# Patient Record
Sex: Male | Born: 1940 | Race: White | Hispanic: No | Marital: Married | Smoking: Current every day smoker
Health system: Southern US, Community
[De-identification: ages and names within clinical notes are randomized; demographics above are authoritative.]

## PROBLEM LIST (undated history)

## (undated) DIAGNOSIS — K56609 Unspecified intestinal obstruction, unspecified as to partial versus complete obstruction: Secondary | ICD-10-CM

---

## 2016-03-07 DIAGNOSIS — E876 Hypokalemia: Secondary | ICD-10-CM | POA: Diagnosis not present

## 2016-03-07 DIAGNOSIS — K56600 Partial intestinal obstruction, unspecified as to cause: Secondary | ICD-10-CM | POA: Diagnosis not present

## 2016-03-07 DIAGNOSIS — K5900 Constipation, unspecified: Secondary | ICD-10-CM | POA: Diagnosis not present

## 2016-03-21 DIAGNOSIS — H26493 Other secondary cataract, bilateral: Secondary | ICD-10-CM | POA: Diagnosis not present

## 2016-06-26 DIAGNOSIS — R0981 Nasal congestion: Secondary | ICD-10-CM | POA: Diagnosis not present

## 2016-06-26 DIAGNOSIS — Z6823 Body mass index (BMI) 23.0-23.9, adult: Secondary | ICD-10-CM | POA: Diagnosis not present

## 2017-01-10 DIAGNOSIS — H6122 Impacted cerumen, left ear: Secondary | ICD-10-CM | POA: Diagnosis not present

## 2017-01-10 DIAGNOSIS — Z6823 Body mass index (BMI) 23.0-23.9, adult: Secondary | ICD-10-CM | POA: Diagnosis not present

## 2017-03-01 DIAGNOSIS — L570 Actinic keratosis: Secondary | ICD-10-CM | POA: Diagnosis not present

## 2017-03-26 DIAGNOSIS — H26493 Other secondary cataract, bilateral: Secondary | ICD-10-CM | POA: Diagnosis not present

## 2017-12-20 DIAGNOSIS — Z23 Encounter for immunization: Secondary | ICD-10-CM | POA: Diagnosis not present

## 2018-03-05 ENCOUNTER — Emergency Department (HOSPITAL_COMMUNITY): Payer: Medicare Other

## 2018-03-05 ENCOUNTER — Encounter (HOSPITAL_COMMUNITY): Payer: Self-pay | Admitting: Emergency Medicine

## 2018-03-05 ENCOUNTER — Emergency Department (HOSPITAL_COMMUNITY)
Admission: EM | Admit: 2018-03-05 | Discharge: 2018-03-05 | Disposition: A | Discharge status: Home / Self Care | Payer: Medicare Other | Attending: Emergency Medicine | Admitting: Emergency Medicine

## 2018-03-05 ENCOUNTER — Other Ambulatory Visit: Payer: Self-pay

## 2018-03-05 DIAGNOSIS — N210 Calculus in bladder: Secondary | ICD-10-CM | POA: Diagnosis not present

## 2018-03-05 DIAGNOSIS — K8020 Calculus of gallbladder without cholecystitis without obstruction: Secondary | ICD-10-CM | POA: Diagnosis not present

## 2018-03-05 DIAGNOSIS — R112 Nausea with vomiting, unspecified: Secondary | ICD-10-CM | POA: Diagnosis not present

## 2018-03-05 DIAGNOSIS — I714 Abdominal aortic aneurysm, without rupture, unspecified: Secondary | ICD-10-CM

## 2018-03-05 DIAGNOSIS — Z79899 Other long term (current) drug therapy: Secondary | ICD-10-CM | POA: Diagnosis not present

## 2018-03-05 DIAGNOSIS — R109 Unspecified abdominal pain: Secondary | ICD-10-CM | POA: Diagnosis not present

## 2018-03-05 DIAGNOSIS — R140 Abdominal distension (gaseous): Secondary | ICD-10-CM | POA: Diagnosis present

## 2018-03-05 HISTORY — DX: Unspecified intestinal obstruction, unspecified as to partial versus complete obstruction: K56.609

## 2018-03-05 LAB — CBC WITH DIFFERENTIAL/PLATELET
Abs Immature Granulocytes: 0.04 10*3/uL (ref 0.00–0.07)
BASOS ABS: 0 10*3/uL (ref 0.0–0.1)
Basophils Relative: 0 %
Eosinophils Absolute: 0.1 10*3/uL (ref 0.0–0.5)
Eosinophils Relative: 1 %
HCT: 49 % (ref 39.0–52.0)
Hemoglobin: 15.7 g/dL (ref 13.0–17.0)
Immature Granulocytes: 0 %
Lymphocytes Relative: 12 %
Lymphs Abs: 1.2 10*3/uL (ref 0.7–4.0)
MCH: 29.7 pg (ref 26.0–34.0)
MCHC: 32 g/dL (ref 30.0–36.0)
MCV: 92.6 fL (ref 80.0–100.0)
Monocytes Absolute: 0.9 10*3/uL (ref 0.1–1.0)
Monocytes Relative: 8 %
Neutro Abs: 8 10*3/uL — ABNORMAL HIGH (ref 1.7–7.7)
Neutrophils Relative %: 79 %
Platelets: 273 10*3/uL (ref 150–400)
RBC: 5.29 MIL/uL (ref 4.22–5.81)
RDW: 13.2 % (ref 11.5–15.5)
WBC: 10.2 10*3/uL (ref 4.0–10.5)
nRBC: 0 % (ref 0.0–0.2)

## 2018-03-05 LAB — COMPREHENSIVE METABOLIC PANEL
ALBUMIN: 3.7 g/dL (ref 3.5–5.0)
ALT: 17 U/L (ref 0–44)
ANION GAP: 7 (ref 5–15)
AST: 19 U/L (ref 15–41)
Alkaline Phosphatase: 37 U/L — ABNORMAL LOW (ref 38–126)
BUN: 28 mg/dL — ABNORMAL HIGH (ref 8–23)
CO2: 28 mmol/L (ref 22–32)
Calcium: 9.4 mg/dL (ref 8.9–10.3)
Chloride: 107 mmol/L (ref 98–111)
Creatinine, Ser: 1.12 mg/dL (ref 0.61–1.24)
GFR calc Af Amer: >60 mL/min (ref >60)
GFR calc non Af Amer: >60 mL/min (ref >60)
Glucose, Bld: 125 mg/dL — ABNORMAL HIGH (ref 70–99)
Potassium: 4.2 mmol/L (ref 3.5–5.1)
Sodium: 142 mmol/L (ref 135–145)
Total Bilirubin: 1.2 mg/dL (ref 0.3–1.2)
Total Protein: 6.4 g/dL — ABNORMAL LOW (ref 6.5–8.1)

## 2018-03-05 LAB — URINALYSIS, ROUTINE W REFLEX MICROSCOPIC
Bilirubin Urine: NEGATIVE
Glucose, UA: NEGATIVE mg/dL
KETONES UR: 5 mg/dL — AB
Nitrite: NEGATIVE
Protein, ur: NEGATIVE mg/dL
Specific Gravity, Urine: 1.02 (ref 1.005–1.030)
pH: 6 (ref 5.0–8.0)

## 2018-03-05 LAB — CG4 I-STAT (LACTIC ACID)
Lactic Acid, Venous: 1.4 mmol/L (ref 0.5–1.9)
Lactic Acid, Venous: 0.93 mmol/L (ref 0.5–1.9)

## 2018-03-05 MED ORDER — IOPAMIDOL (ISOVUE-300) INJECTION 61%
INTRAVENOUS | Status: AC
  Filled 2018-03-05: qty 100

## 2018-03-05 MED ORDER — IOPAMIDOL (ISOVUE-300) INJECTION 61%
100.0000 mL | Freq: Once | INTRAVENOUS | Status: AC | PRN
  Administered 2018-03-05: 100 mL via INTRAVENOUS

## 2018-03-05 MED ORDER — SODIUM CHLORIDE 0.9 % IV BOLUS
1000.0000 mL | Freq: Once | INTRAVENOUS | Status: AC
  Administered 2018-03-05: 1000 mL via INTRAVENOUS

## 2018-03-05 MED ORDER — SODIUM CHLORIDE (PF) 0.9 % IJ SOLN
INTRAMUSCULAR | Status: AC
  Filled 2018-03-05: qty 50

## 2018-03-05 NOTE — ED Triage Notes (Signed)
Patient c/o abdominal pain since x3 days. Last BM on Sunday. Hx SBO. Denies N/V/D.

## 2018-03-05 NOTE — ED Provider Notes (Signed)
[facility]-EMERGENCY DEPT Provider Note   CSN: [id number] Arrival date & time: 03/05/18  1500     History   Chief Complaint Chief Complaint  Patient presents with  . Abdominal Pain    HPI Omar Jenkins is a 77 y.o. male history of small bowel obstruction here presenting with abdominal pain and distention.  Patient states that for the last 5 days, patient has been having intermittent diffuse abdominal pain and distention.  He states that he had one episode of vomiting 2 days ago that improved.  Patient does have some loose stools as well. Patient states that he had 2 previous admissions for small bowel obstruction, one in [place] and the other one in [place]. Patient denies any previous abdominal surgeries.   The history is provided by the patient.    Past Medical History:  Diagnosis Date  . SBO (small bowel obstruction) (HCC)     There are no active problems to display for this patient.   History reviewed. No pertinent surgical history.      Home Medications    Prior to Admission medications   Medication Sig Start Date End Date Taking? Authorizing Provider  tamsulosin (FLOMAX) 0.4 MG CAPS capsule Take 0.8 mg by mouth daily.   Yes [provider]    Family History No family history on file.  Social History Social History   Tobacco Use  . Smoking status: Not on file  Substance Use Topics  . Alcohol use: Not on file  . Drug use: Not on file     Allergies   Patient has no known allergies.   Review of Systems Review of Systems  Gastrointestinal: Positive for abdominal pain.  All other systems reviewed and are negative.    Physical Exam Updated Vital Signs BP (!) 143/81   Pulse (!) 58   Temp 97.9 F (36.6 C) (Oral)   Resp 17   SpO2 96%   Physical Exam Vitals signs and nursing note reviewed.  Constitutional:      Appearance: He is well-developed.  HENT:     Head: Normocephalic.     Mouth/Throat:     Mouth:  Mucous membranes are moist.  Eyes:     Extraocular Movements: Extraocular movements intact.  Cardiovascular:     Rate and Rhythm: Normal rate.  Pulmonary:     Effort: Pulmonary effort is normal.     Breath sounds: Normal breath sounds.  Abdominal:     Palpations: Abdomen is soft.     Comments: Slightly distended, mild diffuse tenderness, no rebound   Skin:    General: Skin is warm.     Capillary Refill: Capillary refill takes less than 2 seconds.  Neurological:     General: No focal deficit present.     Mental Status: He is alert.  Psychiatric:        Mood and Affect: Mood normal.        Behavior: Behavior normal.      ED Treatments / Results  Labs (all labs ordered are listed, but only abnormal results are displayed) Labs Reviewed  CBC WITH DIFFERENTIAL/PLATELET - Abnormal; Notable for the following components:      Result Value   Neutro Abs 8.0 (*)    All other components within normal limits  COMPREHENSIVE METABOLIC PANEL - Abnormal; Notable for the following components:   Glucose, Bld 125 (*)    BUN 28 (*)    Total Protein 6.4 (*)    Alkaline Phosphatase 37 (*)  All other components within normal limits  URINALYSIS, ROUTINE W REFLEX MICROSCOPIC - Abnormal; Notable for the following components:   Hgb urine dipstick SMALL (*)    Ketones, ur 5 (*)    Leukocytes, UA TRACE (*)    Bacteria, UA RARE (*)    All other components within normal limits  I-STAT CG4 LACTIC ACID, ED  CG4 I-STAT (LACTIC ACID)  I-STAT CG4 LACTIC ACID, ED  CG4 I-STAT (LACTIC ACID)    EKG None  Radiology Ct Abdomen Pelvis W Contrast  Result Date: 03/05/2018 CLINICAL DATA:  77 y/o M; 3 days of abdominal pain. Last bowel movement on Sunday. History of small bowel obstruction. EXAM: CT ABDOMEN AND PELVIS WITH CONTRAST TECHNIQUE: Multidetector CT imaging of the abdomen and pelvis was performed using the standard protocol following bolus administration of intravenous contrast. CONTRAST:  100mL  ISOVUE-300 IOPAMIDOL (ISOVUE-300) INJECTION 61% COMPARISON:  03/05/2018 abdominal radiographs. FINDINGS: Lower chest: 3-4 mm perifissural nodules in the right middle lobe (series 4, image 9 and 17). Hepatobiliary: Subcentimeter hypodensity within the liver dome, likely cyst. No additional focal liver lesion. Cholelithiasis. No gallbladder wall thickening or pericholecystic fluid. No biliary ductal dilatation. Pancreas: Unremarkable. No pancreatic ductal dilatation or surrounding inflammatory changes. Spleen: Normal in size without focal abnormality. Adrenals/Urinary Tract: Normal adrenal glands. 2 mm left kidney lower pole nonobstructing stone. Tiny cortical lucency in the upper pole of left kidney, likely cyst. Small calcifications within the left posterior bladder likely representing bladder stones (series 2, image 76). Punctate calcifications which appear to be within intramural left ureterovesicular junction (series 5 image 55 and 56). No associated hydronephrosis. Stomach/Bowel: Stomach is within normal limits. Appendix appears normal. No evidence of bowel wall thickening, distention, or inflammatory changes. Vascular/Lymphatic: Aortic atherosclerosis. No enlarged abdominal or pelvic lymph nodes. 4.4 cm infrarenal abdominal aortic aneurysm with extensive smooth mural thrombus (series 5, image 40 and series 6, image 56). 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 Other: Small left inguinal hernia containing fat. Musculoskeletal: No fracture is seen. Mild spondylosis of the lumbar spine. IMPRESSION: 1. Punctate calcifications within the left posterior bladder likely representing bladder stones. Additional punctate calcifications, probably within the intramural left ureterovesicular junction, no associated hydronephrosis. 2. 2 mm left kidney lower pole nonobstructing stone. 3. Cholelithiasis. 4. 4.4 cm infrarenal abdominal aortic  aneurysm. Recommend followup by ultrasound in 1 year. This recommendation follows ACR consensus guidelines: White Paper of the ACR Incidental Findings Committee II on Vascular Findings. J Am Coll Radiol 2013; 10:789-794. 5. 3-4 mm perifissural nodules in the right middle lobe. No follow-up needed if patient is low-risk (and has no known or suspected primary neoplasm). Non-contrast chest CT can be considered in 12 months if patient is high-risk. This recommendation follows the consensus statement: Guidelines for Management of Incidental Pulmonary Nodules Detected on CT Images: From the Fleischner Society 2017; Radiology 2017; 284:228-243. 6. Small left inguinal hernia containing fat. 7. 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 Electronically Signed   By: Mitzi HansenLance  Furusawa-Stratton M.D.   On: 03/05/2018 19:53   Dg Abdomen Acute W/chest  Result Date: 03/05/2018 CLINICAL DATA:  Abdominal pain, nausea and vomiting since some the, smoking history EXAM: DG ABDOMEN ACUTE W/ 1V CHEST COMPARISON:  None. FINDINGS: No active infiltrate or effusion is seen. Minimally prominent lung markings most likely reflect a long time smoking history. Mediastinal and hilar contours are unremarkable and the descending thoracic aorta is ectatic. The heart is within  normal limits in size. Supine and erect views of the abdomen show no bowel obstruction. There is no radiographic evidence of constipation. No free air is a rounded area of higher attenuation in the overlying the right lower quadrant may be artifactual, due to a skin lesion, or possibly represent previous contrast within a diverticulum. IMPRESSION: 1. No active lung disease. 2. No bowel obstruction. No free air. No radiographic evidence of osteomyelitis. 3. Can not exclude a small left lower pole renal calculus. Electronically Signed   By: Dwyane DeePaul  Barry M.D.   On: 03/05/2018 16:27    Procedures Procedures (including critical care time)  Medications Ordered in ED Medications  iopamidol  (ISOVUE-300) 61 % injection (has no administration in time range)  sodium chloride (PF) 0.9 % injection (has no administration in time range)  sodium chloride 0.9 % bolus 1,000 mL (1,000 mLs Intravenous New Bag/Given 03/05/18 1906)  iopamidol (ISOVUE-300) 61 % injection 100 mL (100 mLs Intravenous Contrast Given 03/05/18 1927)     Initial Impression / Assessment and Plan / ED Course  I have reviewed the triage vital signs and the nursing notes.  Pertinent labs & imaging results that were available during my care of the patient were reviewed by me and considered in my medical decision making (see chart for details).    Thornton ParkBobby Harner is a 77 y.o. male here with abdominal pain. Hx of SBO. Will get labs, UA, CT ab/pel. Will hydrate and reassess. Likely viral gastro vs partial SBO.   8:33 PM UA showed some blood. CT showed bladder stones, no SBO. There is incidental 4.4 infrarenal aortic aneurysm. There is no hydro. I think patient likely passed kidney stone. Stable for discharge. Will have him follow up with urology and PCP (for the aortic aneurysm)   Final Clinical Impressions(s) / ED Diagnoses   Final diagnoses:  None    ED Discharge Orders    None       Charlynne PanderYao, Chyenne Sobczak Hsienta, MD 03/05/18 2035

## 2018-03-05 NOTE — ED Notes (Signed)
PA at bedside.

## 2018-03-05 NOTE — Discharge Instructions (Signed)
You likely had passed some kidney stones. There are still some stones in your bladder and you will likely be able to pass them.   You have incidental aortic aneurysm that you can follow up with your doctor.   See your doctor  Take tylenol, motrin for pain   Return to ER if you have worse abdominal pain, vomiting, fever.

## 2018-04-02 ENCOUNTER — Other Ambulatory Visit: Payer: Self-pay | Admitting: Family

## 2018-04-02 ENCOUNTER — Ambulatory Visit (INDEPENDENT_AMBULATORY_CARE_PROVIDER_SITE_OTHER): Payer: Medicare Other | Admitting: Family

## 2018-04-02 ENCOUNTER — Encounter: Payer: Self-pay | Admitting: Family

## 2018-04-02 ENCOUNTER — Other Ambulatory Visit (INDEPENDENT_AMBULATORY_CARE_PROVIDER_SITE_OTHER): Payer: Medicare Other

## 2018-04-02 VITALS — BP 126/82 | HR 88 | Temp 98.1°F | Wt 148.1 lb

## 2018-04-02 DIAGNOSIS — R3911 Hesitancy of micturition: Secondary | ICD-10-CM

## 2018-04-02 DIAGNOSIS — R911 Solitary pulmonary nodule: Secondary | ICD-10-CM | POA: Diagnosis not present

## 2018-04-02 DIAGNOSIS — H9193 Unspecified hearing loss, bilateral: Secondary | ICD-10-CM

## 2018-04-02 DIAGNOSIS — Z720 Tobacco use: Secondary | ICD-10-CM

## 2018-04-02 DIAGNOSIS — Z13220 Encounter for screening for lipoid disorders: Secondary | ICD-10-CM

## 2018-04-02 DIAGNOSIS — K8020 Calculus of gallbladder without cholecystitis without obstruction: Secondary | ICD-10-CM | POA: Diagnosis not present

## 2018-04-02 DIAGNOSIS — I714 Abdominal aortic aneurysm, without rupture, unspecified: Secondary | ICD-10-CM | POA: Insufficient documentation

## 2018-04-02 DIAGNOSIS — R5383 Other fatigue: Secondary | ICD-10-CM | POA: Diagnosis not present

## 2018-04-02 DIAGNOSIS — H9190 Unspecified hearing loss, unspecified ear: Secondary | ICD-10-CM | POA: Insufficient documentation

## 2018-04-02 DIAGNOSIS — K4090 Unilateral inguinal hernia, without obstruction or gangrene, not specified as recurrent: Secondary | ICD-10-CM | POA: Diagnosis not present

## 2018-04-02 LAB — CBC WITH DIFFERENTIAL/PLATELET
Basophils Absolute: 0.1 10*3/uL (ref 0.0–0.1)
Basophils Relative: 1 % (ref 0.0–3.0)
EOS ABS: 0.2 10*3/uL (ref 0.0–0.7)
Eosinophils Relative: 1.8 % (ref 0.0–5.0)
HEMATOCRIT: 51.3 % (ref 39.0–52.0)
Hemoglobin: 17.3 g/dL — ABNORMAL HIGH (ref 13.0–17.0)
Lymphocytes Relative: 13.1 % (ref 12.0–46.0)
Lymphs Abs: 1.2 10*3/uL (ref 0.7–4.0)
MCHC: 33.8 g/dL (ref 30.0–36.0)
MCV: 90.4 fl (ref 78.0–100.0)
Monocytes Absolute: 0.9 10*3/uL (ref 0.1–1.0)
Monocytes Relative: 9.7 % (ref 3.0–12.0)
Neutro Abs: 7.1 10*3/uL (ref 1.4–7.7)
Neutrophils Relative %: 74.4 % (ref 43.0–77.0)
PLATELETS: 274 10*3/uL (ref 150.0–400.0)
RBC: 5.67 Mil/uL (ref 4.22–5.81)
RDW: 13.3 % (ref 11.5–15.5)
WBC: 9.6 10*3/uL (ref 4.0–10.5)

## 2018-04-02 LAB — COMPREHENSIVE METABOLIC PANEL
ALT: 13 U/L (ref 0–53)
AST: 17 U/L (ref 0–37)
Albumin: 4.4 g/dL (ref 3.5–5.2)
Alkaline Phosphatase: 57 U/L (ref 39–117)
BUN: 19 mg/dL (ref 6–23)
CALCIUM: 10.7 mg/dL — AB (ref 8.4–10.5)
CO2: 27 mEq/L (ref 19–32)
Chloride: 104 mEq/L (ref 96–112)
Creatinine, Ser: 1.14 mg/dL (ref 0.40–1.50)
GFR: 61.89 mL/min (ref >60.00)
Glucose, Bld: 92 mg/dL (ref 70–99)
Potassium: 4.5 mEq/L (ref 3.5–5.1)
Sodium: 140 mEq/L (ref 135–145)
Total Bilirubin: 1.1 mg/dL (ref 0.2–1.2)
Total Protein: 7.3 g/dL (ref 6.0–8.3)

## 2018-04-02 LAB — LIPID PANEL
Cholesterol: 125 mg/dL (ref 0–200)
HDL: 31.2 mg/dL — ABNORMAL LOW (ref >39.00)
LDL Cholesterol: 71 mg/dL (ref 0–99)
NonHDL: 93.94
Total CHOL/HDL Ratio: 4
Triglycerides: 113 mg/dL (ref 0.0–149.0)
VLDL: 22.6 mg/dL (ref 0.0–40.0)

## 2018-04-02 LAB — PSA: PSA: 5 ng/mL — ABNORMAL HIGH (ref 0.10–4.00)

## 2018-04-02 MED ORDER — TAMSULOSIN HCL 0.4 MG PO CAPS: 0.8000 mg | ORAL_CAPSULE | Freq: Every day | ORAL | 3 refills | Status: DC

## 2018-04-02 NOTE — Progress Notes (Signed)
Omar Jenkins is a 78 y.o. male with the following history as recorded in EpicCare:  Patient Active Problem List   Diagnosis Date Noted  . Abdominal aortic aneurysm (AAA) without rupture ([place]) 04/02/2018  . Tobacco abuse 04/02/2018  . Decreased hearing 04/02/2018  . Gallstones 04/02/2018  . Lung nodule seen on imaging study 04/02/2018  . Unilateral inguinal hernia without obstruction or gangrene 04/02/2018    Current Outpatient Medications  Medication Sig Dispense Refill  . tamsulosin (FLOMAX) 0.4 MG CAPS capsule Take 2 capsules (0.8 mg total) by mouth daily. 180 capsule 3   No current facility-administered medications for this visit.     Allergies: Patient has no known allergies.  Past Medical History:  Diagnosis Date  . SBO (small bowel obstruction) ([place])     History reviewed. No pertinent surgical history.  History reviewed. No pertinent family history.  Social History   Tobacco Use  . Smoking status: Not on file  Substance Use Topics  . Alcohol use: Not on file    Subjective:  Patient presents today as a new patient; has recently moved here to be closer to family. Notes that wife is ill and they opted to be closer to her son to help with her care; Smokes 3/4 ppd x 60 years; not interested in quitting;  Went to ER on 03/05/2018 with bladder pain- thought to have passed kidney stone; scheduled to see [facility]; is feeling much better with the addition of Flomax; Does not have copies of previous records today- thinks up to date on colonoscopy- mentions that he may have had a "suspicious" place but does not want to do any further testing/ no vaccine records available today;  Would like to review CT results from ER visit- was concerned about aneurysm mentioned at ER;  Admits that not interested in having lots of tests at this time- focused on taking care of his wife at this time;      Objective:  Vitals:   04/02/18 0859  BP: 126/82  Pulse: 88  Temp: 98.1 F (36.7  C)  TempSrc: Oral  SpO2: 96%  Weight: 148 lb 1.9 oz (67.2 kg)    General: Well developed, well nourished, in no acute distress  Skin : Warm and dry.  Head: Normocephalic and atraumatic  Eyes: Sclera and conjunctiva clear; pupils round and reactive to light; extraocular movements intact  Lungs: Respirations unlabored; clear to auscultation bilaterally without wheeze, rales, rhonchi  CVS exam: normal rate and regular rhythm.  Musculoskeletal: No deformities; no active joint inflammation  Extremities: No edema, cyanosis, clubbing  Vessels: Symmetric bilaterally  Neurologic: Alert and oriented; speech intact; face symmetrical; moves all extremities well; CNII-XII intact without focal deficit   Assessment:  1. Other fatigue   2. Lipid screening   3. Urinary hesitancy   4. Abdominal aortic aneurysm (AAA) without rupture (HCC) Chronic  5. Tobacco abuse   6. Decreased hearing of both ears   7. Gallstones   8. Lung nodule seen on imaging study   9. Unilateral inguinal hernia without obstruction or gangrene, recurrence not specified     Plan:  1. Admits does not sleep well as he gets up with his wife multiple times at night; will check CBC, CMP today; 2. Check lipid panel today; 3. Check PSA; good response to Flomax-refill updated; keep planned follow-up with urology; 4. Reviewed CT results- needs repeat U/S in 1 year; 4.4 cm infrarenal abdominal aortic aneurysm. Recommend follow-up by ultrasound in 1 year. 5. Discussed  CT lung cancer screen- he defers at this time; he does not want to quit smoking; 7. Reviewed CT results; patient denies any abdominal pain/ nausea after eating; does not want to see surgeon at this time. 8. Reviewed CT results- needs repeat CT in 12 months; Non-contrast chest CT can be considered in 12 months if patient is high-risk 9. Patent notes he is aware of hernia; no concerns for pain/ does not want to see surgeon at this time.  Return for [facility] for [place] afternoon  appointment.  Orders Placed This Encounter  Procedures  . CBC w/Diff    Standing Status:   Future    Number of Occurrences:   1    Standing Expiration Date:   04/02/2019  . Comp Met (CMET)    Standing Status:   Future    Number of Occurrences:   1    Standing Expiration Date:   04/02/2019  . Lipid panel    Standing Status:   Future    Number of Occurrences:   1    Standing Expiration Date:   04/03/2019  . PSA    Standing Status:   Future    Number of Occurrences:   1    Standing Expiration Date:   04/02/2019    Requested Prescriptions   Signed Prescriptions Disp Refills  . tamsulosin (FLOMAX) 0.4 MG CAPS capsule 180 capsule 3    Sig: Take 2 capsules (0.8 mg total) by mouth daily.

## 2018-04-03 NOTE — Telephone Encounter (Signed)
Copied from CRM [id number]. Topic: Quick Communication - See Telephone Encounter >> Apr 03, 2018  4:35 PM Terisa Starr wrote: CRM for notification. See Telephone encounter for: 04/03/18.  Pt returning call for labs. Please contact patient.

## 2018-04-04 ENCOUNTER — Telehealth: Payer: Self-pay

## 2018-04-04 NOTE — Telephone Encounter (Signed)
Called and left message for patient today with lab results. Also mailed out a copy to him today with lab results and info.

## 2018-04-08 ENCOUNTER — Encounter: Payer: Self-pay | Admitting: Family

## 2018-04-15 ENCOUNTER — Other Ambulatory Visit: Payer: Medicare Other

## 2018-04-16 LAB — PTH, INTACT AND CALCIUM
Calcium: 10.1 mg/dL (ref 8.6–10.3)
PTH: 53 pg/mL (ref 14–64)

## 2018-04-17 DIAGNOSIS — R3915 Urgency of urination: Secondary | ICD-10-CM | POA: Diagnosis not present

## 2018-04-17 DIAGNOSIS — N200 Calculus of kidney: Secondary | ICD-10-CM | POA: Diagnosis not present

## 2018-04-17 DIAGNOSIS — N401 Enlarged prostate with lower urinary tract symptoms: Secondary | ICD-10-CM | POA: Diagnosis not present

## 2018-04-17 DIAGNOSIS — N210 Calculus in bladder: Secondary | ICD-10-CM | POA: Diagnosis not present

## 2018-05-29 ENCOUNTER — Telehealth: Payer: Self-pay | Admitting: *Deleted

## 2018-05-29 NOTE — Telephone Encounter (Signed)
error 

## 2018-05-30 ENCOUNTER — Telehealth: Payer: Self-pay | Admitting: *Deleted

## 2018-05-30 NOTE — Telephone Encounter (Signed)
Called patient and LVM to inform them the nurse needs to either convert their upcoming AWV to a virtual visit or reschedule the visit out into the future due to covid-19 safety measures. Nurse rescheduled the appointment for 08/14/18

## 2018-06-05 ENCOUNTER — Ambulatory Visit: Payer: Medicare Other

## 2018-08-13 NOTE — Progress Notes (Addendum)
Subjective:   Omar ParkBobby Stirling is a 78 y.o. male who presents for an Initial Medicare Annual Wellness Visit. I connected with patient by a telephone and verified that I am speaking with the correct person using two identifiers. Patient stated full name and DOB. Patient gave permission to continue with telephonic visit. Patient's location was at home and Nurse's location was at [facility] office.   Review of Systems  No ROS.  Medicare Wellness Virtual Visit.  Visual/audio telehealth visit, UTA vital signs.   See social history for additional risk factors.  Cardiac Risk Factors include: male gender Sleep patterns: feels rested on waking, gets up 2-3 times nightly to void and sleeps  6-8 hours nightly.    Home Safety/Smoke Alarms: Feels safe in home. Smoke alarms in place.  Living environment; residence and Firearm Safety: 1-story house/ trailer. Lives with wife and son, no needs for DME, good support system Seat Belt Safety/Bike Helmet: Wears seat belt.   PSA-  Lab Results  Component Value Date   PSA 5.00 (H) 04/02/2018      Objective:    Today's Vitals   08/14/18 1414  PainSc: 2    There is no height or weight on file to calculate BMI.  Advanced Directives 08/14/2018 03/05/2018  Does Patient Have a Medical Advance Directive? Yes Yes  Type of Estate agentAdvance Directive Healthcare Power of [place];Living will Healthcare Power of [place];Living will  Copy of Healthcare Power of Attorney in Chart? No - copy requested -    Current Medications (verified) Outpatient Encounter Medications as of 08/14/2018  Medication Sig  . acetaminophen (TYLENOL) 325 MG tablet Take 650 mg by mouth every 6 (six) hours as needed.  . tamsulosin (FLOMAX) 0.4 MG CAPS capsule Take 2 capsules (0.8 mg total) by mouth daily.   No facility-administered encounter medications on file as of 08/14/2018.     Allergies (verified) Patient has no known allergies.   History: Past Medical History:  Diagnosis Date  . SBO  (small bowel obstruction) (HCC)    History reviewed. No pertinent surgical history. No family history on file. Social History   Socioeconomic History  . Marital status: Married    Spouse name: Not on file  . Number of children: 1  . Years of education: Not on file  . Highest education level: Not on file  Occupational History  . Occupation: retired  Engineer, productionocial Needs  . Financial resource strain: Not hard at all  . Food insecurity:    Worry: Never true    Inability: Never true  . Transportation needs:    Medical: No    Non-medical: No  Tobacco Use  . Smoking status: Current Every Day Smoker    Years: 60.00    Types: Cigarettes  . Smokeless tobacco: Never Used  Substance and Sexual Activity  . Alcohol use: Yes    Comment: occassionally  . Drug use: Not Currently  . Sexual activity: Never  Lifestyle  . Physical activity:    Days per week: 0 days    Minutes per session: 0 min  . Stress: Not at all  Relationships  . Social connections:    Talks on phone: More than three times a week    Gets together: More than three times a week    Attends religious service: Not on file    Active member of club or organization: Not on file    Attends meetings of clubs or organizations: Not on file    Relationship status: Not on file  Other Topics Concern  . Not on file  Social History Narrative  . Not on file   Tobacco Counseling Ready to quit: No Counseling given: No  Activities of Daily Living In your present state of health, do you have any difficulty performing the following activities: 08/14/2018  Hearing? N  Vision? N  Difficulty concentrating or making decisions? N  Walking or climbing stairs? N  Dressing or bathing? N  Doing errands, shopping? N  Preparing Food and eating ? N  Using the Toilet? N  In the past six months, have you accidently leaked urine? N  Do you have problems with loss of bowel control? N  Managing your Medications? N  Managing your Finances? N   Housekeeping or managing your Housekeeping? N     Immunizations and Health Maintenance  There is no immunization history on file for this patient. Health Maintenance Due  Topic Date Due  . TETANUS/TDAP  10/17/1959  . PNA vac Low Risk Adult (1 of 2 - PCV13) 10/18/2003    Patient Care Team: Marrian Salvage, FNP as PCP - General (Internal Medicine)  Indicate any recent Medical Services you may have received from other than [facility] providers in the past year (date may be approximate).    Assessment:   This is a routine wellness examination for [place]. Physical assessment deferred to PCP.  Hearing/Vision screen Hearing Screening Comments: HOH, states he cannot afford hearing aids. Resources were sent to patient via mail. Vision Screening Comments: appointment yearly   Dietary issues and exercise activities discussed: Current Exercise Habits: The patient does not participate in regular exercise at present, Exercise limited by: orthopedic condition(s)  Diet (meal preparation, eat out, water intake, caffeinated beverages, dairy products, fruits and vegetables): in general, a "healthy" diet  , well balanced. States he maintains his weight but his appetite has decreased.   Discussed supplementing with Ensure and eating foods with high protein. Encouraged patient to increase daily water and healthy fluid intake.  Goals    . Patient Stated     I want to maintain my current health status and remain as independent as possible.      Depression Screen PHQ 2/9 Scores 08/14/2018 04/02/2018  PHQ - 2 Score 0 0     Cognitive Function:       Ad8 score reviewed for issues:  Issues making decisions: no  Less interest in hobbies / activities: no  Repeats questions, stories (family complaining): no  Trouble using ordinary gadgets (microwave, computer, phone):no  Forgets the month or year: no  Mismanaging finances: no  Remembering appts: no  Daily problems with thinking and/or  memory: no Ad8 score is= 0  Screening Tests Health Maintenance  Topic Date Due  . TETANUS/TDAP  10/17/1959  . PNA vac Low Risk Adult (1 of 2 - PCV13) 10/18/2003  . INFLUENZA VACCINE  10/05/2018      Plan:    Reviewed health maintenance screenings with patient today and relevant education, vaccines, and/or referrals were provided.   Continue to eat heart healthy diet (full of fruits, vegetables, whole grains, lean protein, water--limit salt, fat, and sugar intake) and increase physical activity as tolerated.  Continue doing brain stimulating activities (puzzles, reading, adult coloring books, staying active) to keep memory sharp.   I have personally reviewed and noted the following in the patient's chart:   . Medical and social history . Use of alcohol, tobacco or illicit drugs  . Current medications and supplements . Functional ability and status .  Nutritional status . Physical activity . Advanced directives . List of other physicians . Screenings to include cognitive, depression, and falls . Referrals and appointments  In addition, I have reviewed and discussed with patient certain preventive protocols, quality metrics, and best practice recommendations. A written personalized care plan for preventive services as well as general preventive health recommendations were provided to patient.     Wanda PlumpJill A Arista Kettlewell, RN   08/14/2018   Medical screening examination/treatment/procedure(s) were performed by non-physician practitioner and as supervising provider I was immediately available for consultation/collaboration.  I agree with above. Olive BassLaura Woodruff Murray, FNP

## 2018-08-14 ENCOUNTER — Other Ambulatory Visit: Payer: Self-pay

## 2018-08-14 ENCOUNTER — Ambulatory Visit (INDEPENDENT_AMBULATORY_CARE_PROVIDER_SITE_OTHER): Payer: Medicare Other | Admitting: *Deleted

## 2018-08-14 DIAGNOSIS — Z0000 Encounter for general adult medical examination without abnormal findings: Secondary | ICD-10-CM

## 2018-08-14 NOTE — Patient Instructions (Signed)
Continue doing brain stimulating activities (puzzles, reading, adult coloring books, staying active) to keep memory sharp.   Continue to eat heart healthy diet (full of fruits, vegetables, whole grains, lean protein, water--limit salt, fat, and sugar intake) and increase physical activity as tolerated.   Mr. Omar Jenkins , Thank you for taking time to come for your Medicare Wellness Visit. I appreciate your ongoing commitment to your health goals. Please review the following plan we discussed and let me know if I can assist you in the future.   These are the goals we discussed: Goals    . Patient Stated     I want to maintain my current health status and remain as independent as possible.       This is a list of the screening recommended for you and due dates:  Health Maintenance  Topic Date Due  . Tetanus Vaccine  [birth year]/17/1957  . Pneumonia vaccines (1 of 2 - PCV13) 10/18/2003  . Flu Shot  10/05/2018    Preventive Care 65 Years and Older, Male Preventive care refers to lifestyle choices and visits with your health care provider that can promote health and wellness. What does preventive care include?   A yearly physical exam. This is also called an annual well check.  Dental exams once or twice a year.  Routine eye exams. Ask your health care provider how often you should have your eyes checked.  Personal lifestyle choices, including: ? Daily care of your teeth and gums. ? Regular physical activity. ? Eating a healthy diet. ? Avoiding tobacco and drug use. ? Limiting alcohol use. ? Practicing safe sex. ? Taking low doses of aspirin every day. ? Taking vitamin and mineral supplements as recommended by your health care provider. What happens during an annual well check? The services and screenings done by your health care provider during your annual well check will depend on your age, overall health, lifestyle risk factors, and family history of disease. Counseling Your health care  provider may ask you questions about your:  Alcohol use.  Tobacco use.  Drug use.  Emotional well-being.  Home and relationship well-being.  Sexual activity.  Eating habits.  History of falls.  Memory and ability to understand (cognition).  Work and work Statistician. Screening You may have the following tests or measurements:  Height, weight, and BMI.  Blood pressure.  Lipid and cholesterol levels. These may be checked every 5 years, or more frequently if you are over 66 years old.  Skin check.  Lung cancer screening. You may have this screening every year starting at age 54 if you have a 30-pack-year history of smoking and currently smoke or have quit within the past 15 years.  Colorectal cancer screening. All adults should have this screening starting at age 21 and continuing until age 49. You will have tests every 1-10 years, depending on your results and the type of screening test. People at increased risk should start screening at an earlier age. Screening tests may include: ? Guaiac-based fecal occult blood testing. ? Fecal immunochemical test (FIT). ? Stool DNA test. ? Virtual colonoscopy. ? Sigmoidoscopy. During this test, a flexible tube with a tiny camera (sigmoidoscope) is used to examine your rectum and lower colon. The sigmoidoscope is inserted through your anus into your rectum and lower colon. ? Colonoscopy. During this test, a long, thin, flexible tube with a tiny camera (colonoscope) is used to examine your entire colon and rectum.  Prostate cancer screening. Recommendations will vary depending  on your family history and other risks.  Hepatitis C blood test.  Hepatitis B blood test.  Sexually transmitted disease (STD) testing.  Diabetes screening. This is done by checking your blood sugar (glucose) after you have not eaten for a while (fasting). You may have this done every 1-3 years.  Abdominal aortic aneurysm (AAA) screening. You may need this if  you are a current or former smoker.  Osteoporosis. You may be screened starting at age 19 if you are at high risk. Talk with your health care provider about your test results, treatment options, and if necessary, the need for more tests. Vaccines Your health care provider may recommend certain vaccines, such as:  Influenza vaccine. This is recommended every year.  Tetanus, diphtheria, and acellular pertussis (Tdap, Td) vaccine. You may need a Td booster every 10 years.  Varicella vaccine. You may need this if you have not been vaccinated.  Zoster vaccine. You may need this after age 67.  Measles, mumps, and rubella (MMR) vaccine. You may need at least one dose of MMR if you were born in 1957 or later. You may also need a second dose.  Pneumococcal 13-valent conjugate (PCV13) vaccine. One dose is recommended after age 53.  Pneumococcal polysaccharide (PPSV23) vaccine. One dose is recommended after age 28.  Meningococcal vaccine. You may need this if you have certain conditions.  Hepatitis A vaccine. You may need this if you have certain conditions or if you travel or work in places where you may be exposed to hepatitis A.  Hepatitis B vaccine. You may need this if you have certain conditions or if you travel or work in places where you may be exposed to hepatitis B.  Haemophilus influenzae type b (Hib) vaccine. You may need this if you have certain risk factors. Talk to your health care provider about which screenings and vaccines you need and how often you need them. This information is not intended to replace advice given to you by your health care provider. Make sure you discuss any questions you have with your health care provider. Document Released: 03/19/2015 Document Revised: 04/12/2017 Document Reviewed: 12/22/2014 Elsevier Interactive Patient Education  2019 [employer].

## 2018-10-21 DIAGNOSIS — R3915 Urgency of urination: Secondary | ICD-10-CM | POA: Diagnosis not present

## 2018-10-21 DIAGNOSIS — N210 Calculus in bladder: Secondary | ICD-10-CM | POA: Diagnosis not present

## 2018-10-21 DIAGNOSIS — N401 Enlarged prostate with lower urinary tract symptoms: Secondary | ICD-10-CM | POA: Diagnosis not present

## 2018-11-06 DIAGNOSIS — H6123 Impacted cerumen, bilateral: Secondary | ICD-10-CM | POA: Diagnosis not present

## 2018-12-03 DIAGNOSIS — Z23 Encounter for immunization: Secondary | ICD-10-CM | POA: Diagnosis not present

## 2019-04-17 DIAGNOSIS — Z23 Encounter for immunization: Secondary | ICD-10-CM | POA: Diagnosis not present

## 2019-05-15 DIAGNOSIS — Z23 Encounter for immunization: Secondary | ICD-10-CM | POA: Diagnosis not present

## 2019-06-09 ENCOUNTER — Other Ambulatory Visit: Payer: Self-pay | Admitting: Family

## 2019-06-18 ENCOUNTER — Encounter: Payer: Self-pay | Admitting: Family

## 2019-06-18 ENCOUNTER — Other Ambulatory Visit: Payer: Self-pay

## 2019-06-18 ENCOUNTER — Ambulatory Visit (INDEPENDENT_AMBULATORY_CARE_PROVIDER_SITE_OTHER): Payer: Medicare Other | Admitting: Family

## 2019-06-18 VITALS — BP 116/72 | HR 87 | Temp 98.2°F | Wt 142.0 lb

## 2019-06-18 DIAGNOSIS — R911 Solitary pulmonary nodule: Secondary | ICD-10-CM

## 2019-06-18 DIAGNOSIS — R3911 Hesitancy of micturition: Secondary | ICD-10-CM

## 2019-06-18 DIAGNOSIS — M545 Low back pain, unspecified: Secondary | ICD-10-CM

## 2019-06-18 DIAGNOSIS — I714 Abdominal aortic aneurysm, without rupture, unspecified: Secondary | ICD-10-CM

## 2019-06-18 MED ORDER — TIZANIDINE HCL 4 MG PO CAPS: 4.0000 mg | ORAL_CAPSULE | Freq: Every evening | ORAL | 0 refills | Status: DC | PRN

## 2019-06-18 MED ORDER — TAMSULOSIN HCL 0.4 MG PO CAPS: 0.8000 mg | ORAL_CAPSULE | Freq: Every day | ORAL | 3 refills | Status: AC

## 2019-06-18 MED ORDER — MELOXICAM 15 MG PO TABS: 15.0000 mg | ORAL_TABLET | Freq: Every day | ORAL | 0 refills | Status: DC

## 2019-06-18 NOTE — Progress Notes (Signed)
Omar Jenkins is a 79 y.o. male with the following history as recorded in [employer]:  Patient Active Problem List   Diagnosis Date Noted  . Abdominal aortic aneurysm (AAA) without rupture ([place]) 04/02/2018  . Tobacco abuse 04/02/2018  . Decreased hearing 04/02/2018  . Gallstones 04/02/2018  . Lung nodule seen on imaging study 04/02/2018  . Unilateral inguinal hernia without obstruction or gangrene 04/02/2018    Current Outpatient Medications  Medication Sig Dispense Refill  . acetaminophen (TYLENOL) 325 MG tablet Take 650 mg by mouth every 6 (six) hours as needed.    . meloxicam (MOBIC) 15 MG tablet Take 1 tablet (15 mg total) by mouth daily. 30 tablet 0  . tamsulosin (FLOMAX) 0.4 MG CAPS capsule Take 2 capsules (0.8 mg total) by mouth daily. 180 capsule 3  . tiZANidine (ZANAFLEX) 4 MG capsule Take 1 capsule (4 mg total) by mouth at bedtime as needed for muscle spasms. 30 capsule 0   No current facility-administered medications for this visit.    Allergies: Patient has no known allergies.  Past Medical History:  Diagnosis Date  . SBO (small bowel obstruction) ([place])     History reviewed. No pertinent surgical history.  History reviewed. No pertinent family history.  Social History   Tobacco Use  . Smoking status: Current Every Day Smoker    Years: 60.00    Types: Cigarettes  . Smokeless tobacco: Never Used  Substance Use Topics  . Alcohol use: Yes    Comment: occassionally    Subjective:  Low back pain x 3-4 days; was helping to move his wife and pulled a muscle; no numbness or tingling; "feels tight." Also needs refill on his Flomax- takes daily with good relief of symptoms;   Is due to follow-up on aneurysm/ lung nodules but defers at this time due to being caregiver for his wife; does not want to do any tests at this time;    Objective:  Vitals:   06/18/19 1234  BP: 116/72  Pulse: 87  Temp: 98.2 F (36.8 C)  TempSrc: Oral  SpO2: 98%  Weight: 142 lb (64.4 kg)     General: Well developed, well nourished, in no acute distress  Skin : Warm and dry.  Head: Normocephalic and atraumatic  Lungs: Respirations unlabored; Musculoskeletal: No deformities; no active joint inflammation  Extremities: No edema, cyanosis, clubbing  Vessels: Symmetric bilaterally  Neurologic: Alert and oriented; speech intact; face symmetrical; moves all extremities well; CNII-XII intact without focal deficit   Assessment:  1. Acute bilateral low back pain without sciatica   2. Urinary hesitancy   3. 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 Lung nodule seen on imaging study     Plan:  1. Suspect muscular; Rx for Mobic, Tizanidine; apply heat, rest and follow-up worse, no better. 2. Refill updated on Flomax; 3. Patient defers updating screening at this time- he agrees to consider updating in 3 months; 4. Patient defers updating test at this time- he agrees to consider updating in 3 months;  This visit occurred during the SARS-CoV-2 public health emergency.  Safety protocols were in place, including screening questions prior to the visit, additional usage of staff PPE, and extensive cleaning of exam room while observing appropriate contact time as indicated for disinfecting solutions.      No follow-ups on file.  No orders of the defined types were placed in this encounter.   Requested Prescriptions   Signed Prescriptions Disp Refills  . tamsulosin (FLOMAX) 0.4  MG CAPS capsule 180 capsule 3    Sig: Take 2 capsules (0.8 mg total) by mouth daily.  . meloxicam (MOBIC) 15 MG tablet 30 tablet 0    Sig: Take 1 tablet (15 mg total) by mouth daily.  Marland Kitchen tiZANidine (ZANAFLEX) 4 MG capsule 30 capsule 0    Sig: Take 1 capsule (4 mg total) by mouth at bedtime as needed for muscle spasms.

## 2019-07-10 ENCOUNTER — Other Ambulatory Visit: Payer: Self-pay | Admitting: Family

## 2019-07-15 ENCOUNTER — Other Ambulatory Visit: Payer: Self-pay | Admitting: Family

## 2019-07-17 ENCOUNTER — Other Ambulatory Visit: Payer: Self-pay

## 2019-07-17 ENCOUNTER — Encounter: Payer: Self-pay | Admitting: Internal Medicine

## 2019-07-17 ENCOUNTER — Ambulatory Visit (INDEPENDENT_AMBULATORY_CARE_PROVIDER_SITE_OTHER): Payer: Medicare Other | Admitting: Internal Medicine

## 2019-07-17 VITALS — BP 144/82 | HR 78 | Temp 98.6°F | Wt 140.8 lb

## 2019-07-17 DIAGNOSIS — R3911 Hesitancy of micturition: Secondary | ICD-10-CM | POA: Diagnosis not present

## 2019-07-17 DIAGNOSIS — R310 Gross hematuria: Secondary | ICD-10-CM | POA: Diagnosis not present

## 2019-07-17 LAB — POC URINALSYSI DIPSTICK (AUTOMATED)
Bilirubin, UA: NEGATIVE
Glucose, UA: NEGATIVE
Ketones, UA: NEGATIVE
Leukocytes, UA: NEGATIVE
Nitrite, UA: NEGATIVE
Protein, UA: POSITIVE — AB
Spec Grav, UA: 1.025 (ref 1.010–1.025)
Urobilinogen, UA: 1 E.U./dL
pH, UA: 6 (ref 5.0–8.0)

## 2019-07-17 MED ORDER — CIPROFLOXACIN HCL 500 MG PO TABS: 500.0000 mg | ORAL_TABLET | Freq: Two times a day (BID) | ORAL | 0 refills | Status: AC

## 2019-07-17 MED ORDER — CIPROFLOXACIN HCL 500 MG PO TABS: 500.0000 mg | ORAL_TABLET | Freq: Two times a day (BID) | ORAL | 0 refills | Status: DC

## 2019-07-17 NOTE — Progress Notes (Signed)
   Subjective:   Patient ID: Omar Jenkins, male    DOB: 07-08-1940, 79 y.o.   MRN: [id number]  HPI The patient is an 79 YO man coming in for blood in urine. Started this morning with some red urine. Did have some pain with urination. No pain in his back. Denies fevers or chills. Denies nausea or vomiting. Prior CT abdomen/pelvis 2019 with prostate enlargement, kidney and bladder stones. Is a smoker and continues to smoke. Prior lung nodules without appropriate follow up. Feels like today is urinating more often and with some urgency. Urine has been red to pink since this morning. Denies blood clots. Denies knowingly passing kidneys stones recently or in the past.   Review of Systems  Constitutional: Negative.   HENT: Negative.   Eyes: Negative.   Respiratory: Negative for cough, chest tightness and shortness of breath.   Cardiovascular: Negative for chest pain, palpitations and leg swelling.  Gastrointestinal: Negative for abdominal distention, abdominal pain, constipation, diarrhea, nausea and vomiting.  Genitourinary: Positive for dysuria, frequency, hematuria and urgency. Negative for decreased urine volume, difficulty urinating, enuresis, flank pain, genital sores and penile pain.  Musculoskeletal: Negative.   Skin: Negative.   Neurological: Negative.   Psychiatric/Behavioral: Negative.     Objective:  Physical Exam Constitutional:      Appearance: He is well-developed.  HENT:     Head: Normocephalic and atraumatic.  Cardiovascular:     Rate and Rhythm: Normal rate and regular rhythm.  Pulmonary:     Effort: Pulmonary effort is normal. No respiratory distress.     Breath sounds: Normal breath sounds. No wheezing or rales.  Abdominal:     General: Bowel sounds are normal. There is no distension.     Palpations: Abdomen is soft.     Tenderness: There is no abdominal tenderness. There is no rebound.  Musculoskeletal:     Cervical back: Normal range of motion.  Skin:    General:  Skin is warm and dry.  Neurological:     Mental Status: He is alert and oriented to person, place, and time.     Coordination: Coordination normal.     Vitals:   07/17/19 1607  BP: (!) 144/82  Pulse: 78  Temp: 98.6 F (37 C)  SpO2: 98%  Weight: 140 lb 12.8 oz (63.9 kg)    This visit occurred during the SARS-CoV-2 public health emergency.  Safety protocols were in place, including screening questions prior to the visit, additional usage of staff PPE, and extensive cleaning of exam room while observing appropriate contact time as indicated for disinfecting solutions.   Assessment & Plan:

## 2019-07-17 NOTE — Patient Instructions (Signed)
We have sent in a medicine called ciprofloxacin to take 1 pill twice a day for 5 days to clear the infection.   If you are still having pain or bleeding call us back in 2-3 days.   If the pain worsens go to the emergency room or call us back.   It is possible that this is a kidney stone trying to pass so drink plenty of water/liquids to help.

## 2019-07-18 DIAGNOSIS — R310 Gross hematuria: Secondary | ICD-10-CM | POA: Insufficient documentation

## 2019-07-18 LAB — URINE CULTURE: Result:: NO GROWTH

## 2019-07-18 NOTE — Assessment & Plan Note (Signed)
U/A done in the office with blood and protein. Will send for culture and cover for infection. We talked about how this could be kidney or bladder stones as he has these known in the past. If pain is increasing or not improving or blood does not improve with cipro he will need further evaluation with imaging CT non-contrast. Given his smoking history he will need close follow up to ensure resolution.

## 2019-09-26 ENCOUNTER — Telehealth: Payer: Self-pay | Admitting: Family

## 2019-09-26 NOTE — Telephone Encounter (Signed)
Please call to see if he is agreeable to having the test done to follow-up on the lung nodule and abdominal aneurysm.

## 2019-09-26 NOTE — Telephone Encounter (Signed)
-----   Message from Olive Bass, FNP sent at 06/18/2019 12:49 PM EDT ----- Call patient to see if he is ready to schedule testing

## 2019-09-30 NOTE — Telephone Encounter (Signed)
Notified pt w/Omar Jenkins response. Pt states he has moved to [facility], and he will be finding another provider there, and he will have them to take over his care...[name]

## 2019-10-06 DIAGNOSIS — R310 Gross hematuria: Secondary | ICD-10-CM | POA: Diagnosis not present

## 2019-10-06 DIAGNOSIS — R319 Hematuria, unspecified: Secondary | ICD-10-CM | POA: Diagnosis not present

## 2019-10-06 DIAGNOSIS — F17210 Nicotine dependence, cigarettes, uncomplicated: Secondary | ICD-10-CM | POA: Diagnosis not present

## 2019-10-27 DIAGNOSIS — N200 Calculus of kidney: Secondary | ICD-10-CM | POA: Diagnosis not present

## 2019-10-27 DIAGNOSIS — R319 Hematuria, unspecified: Secondary | ICD-10-CM | POA: Diagnosis not present

## 2019-10-27 DIAGNOSIS — N401 Enlarged prostate with lower urinary tract symptoms: Secondary | ICD-10-CM | POA: Diagnosis not present

## 2019-12-03 DIAGNOSIS — K8020 Calculus of gallbladder without cholecystitis without obstruction: Secondary | ICD-10-CM | POA: Diagnosis not present

## 2019-12-03 DIAGNOSIS — R319 Hematuria, unspecified: Secondary | ICD-10-CM | POA: Diagnosis not present

## 2019-12-03 DIAGNOSIS — I77810 Thoracic aortic ectasia: Secondary | ICD-10-CM | POA: Diagnosis not present

## 2019-12-03 DIAGNOSIS — N401 Enlarged prostate with lower urinary tract symptoms: Secondary | ICD-10-CM | POA: Diagnosis not present

## 2019-12-03 DIAGNOSIS — N200 Calculus of kidney: Secondary | ICD-10-CM | POA: Diagnosis not present

## 2019-12-03 DIAGNOSIS — N400 Enlarged prostate without lower urinary tract symptoms: Secondary | ICD-10-CM | POA: Diagnosis not present

## 2020-01-06 DIAGNOSIS — B351 Tinea unguium: Secondary | ICD-10-CM | POA: Diagnosis not present

## 2020-01-06 DIAGNOSIS — M79675 Pain in left toe(s): Secondary | ICD-10-CM | POA: Diagnosis not present

## 2020-01-06 DIAGNOSIS — L602 Onychogryphosis: Secondary | ICD-10-CM | POA: Diagnosis not present

## 2020-01-06 DIAGNOSIS — Z6822 Body mass index (BMI) 22.0-22.9, adult: Secondary | ICD-10-CM | POA: Diagnosis not present

## 2020-01-06 DIAGNOSIS — M79674 Pain in right toe(s): Secondary | ICD-10-CM | POA: Diagnosis not present

## 2020-01-22 DIAGNOSIS — E781 Pure hyperglyceridemia: Secondary | ICD-10-CM | POA: Diagnosis not present

## 2020-01-22 DIAGNOSIS — N401 Enlarged prostate with lower urinary tract symptoms: Secondary | ICD-10-CM | POA: Diagnosis not present

## 2020-01-22 DIAGNOSIS — R972 Elevated prostate specific antigen [PSA]: Secondary | ICD-10-CM | POA: Diagnosis not present

## 2020-01-22 DIAGNOSIS — Z6822 Body mass index (BMI) 22.0-22.9, adult: Secondary | ICD-10-CM | POA: Diagnosis not present

## 2020-01-22 DIAGNOSIS — Z0000 Encounter for general adult medical examination without abnormal findings: Secondary | ICD-10-CM | POA: Diagnosis not present

## 2020-02-03 DIAGNOSIS — M2041 Other hammer toe(s) (acquired), right foot: Secondary | ICD-10-CM | POA: Diagnosis not present

## 2020-02-03 DIAGNOSIS — B351 Tinea unguium: Secondary | ICD-10-CM | POA: Diagnosis not present

## 2020-02-03 DIAGNOSIS — M2042 Other hammer toe(s) (acquired), left foot: Secondary | ICD-10-CM | POA: Diagnosis not present

## 2020-02-03 DIAGNOSIS — M79674 Pain in right toe(s): Secondary | ICD-10-CM | POA: Diagnosis not present

## 2020-04-03 ENCOUNTER — Other Ambulatory Visit: Payer: Self-pay | Admitting: Family

## 2020-04-27 DIAGNOSIS — M79674 Pain in right toe(s): Secondary | ICD-10-CM | POA: Diagnosis not present

## 2020-04-27 DIAGNOSIS — M79675 Pain in left toe(s): Secondary | ICD-10-CM | POA: Diagnosis not present

## 2020-04-27 DIAGNOSIS — B351 Tinea unguium: Secondary | ICD-10-CM | POA: Diagnosis not present

## 2020-06-27 IMAGING — CR DG ABDOMEN ACUTE W/ 1V CHEST
3 series · 3 of 3 positions shown · non-contrast
Comparison: None.

CLINICAL DATA: Abdominal pain, nausea and vomiting since some the,
smoking history

EXAM:
DG ABDOMEN ACUTE W/ 1V CHEST

[w chest pa]
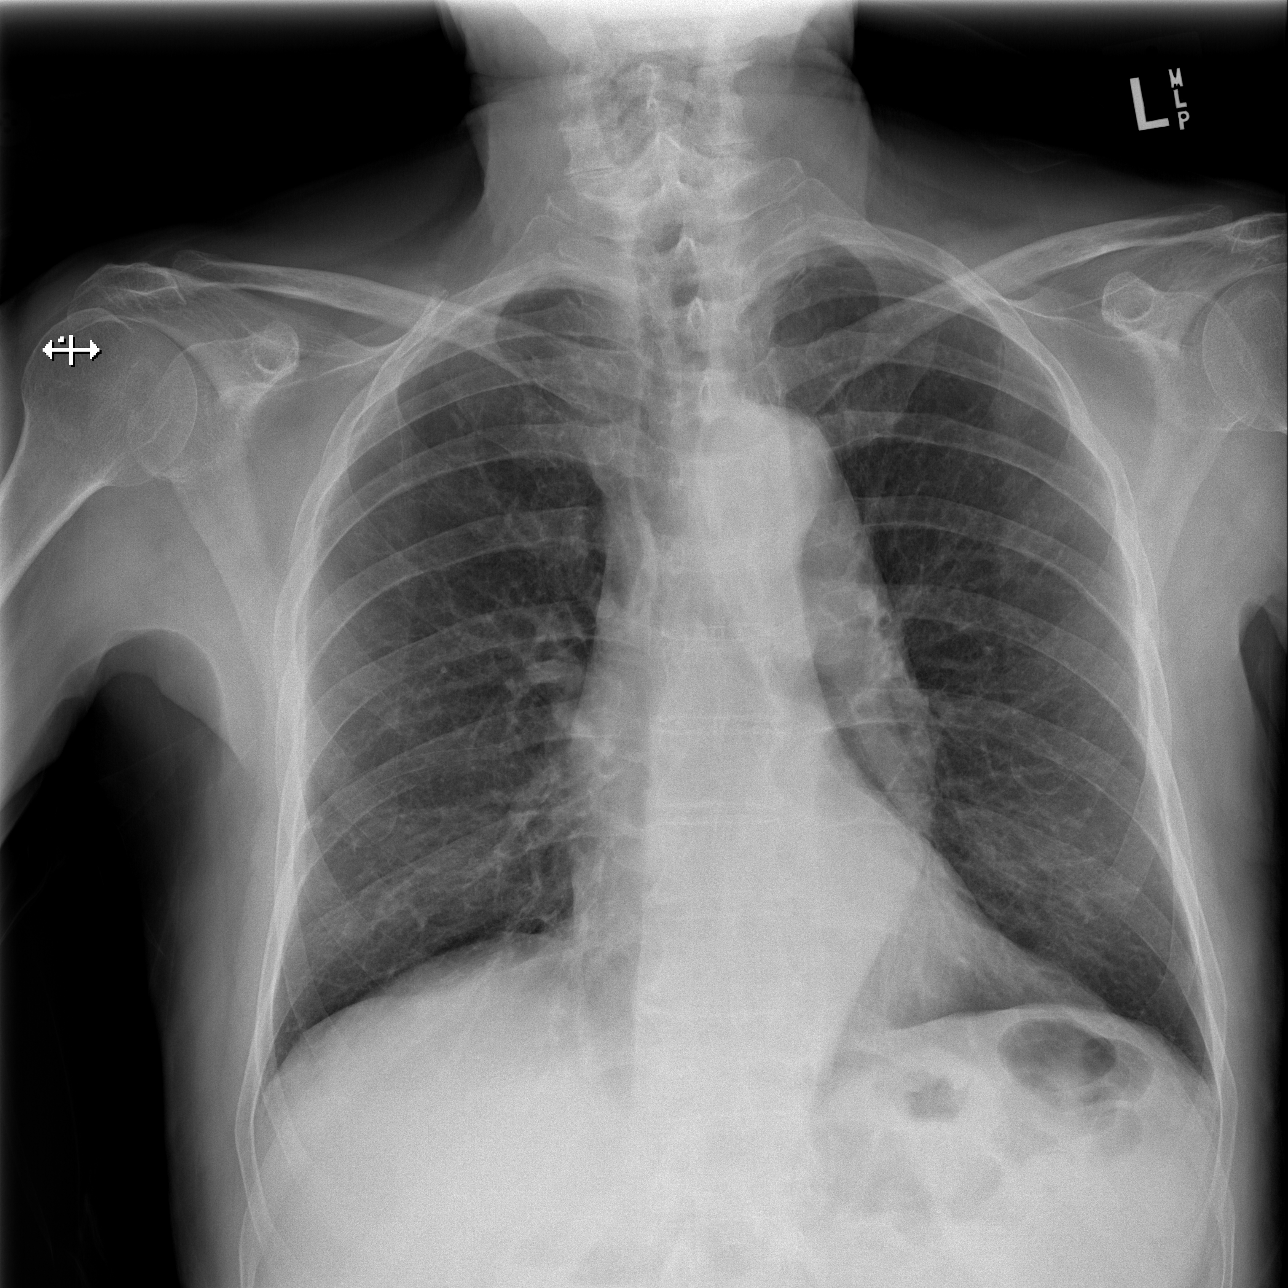

[w abdomen upright]
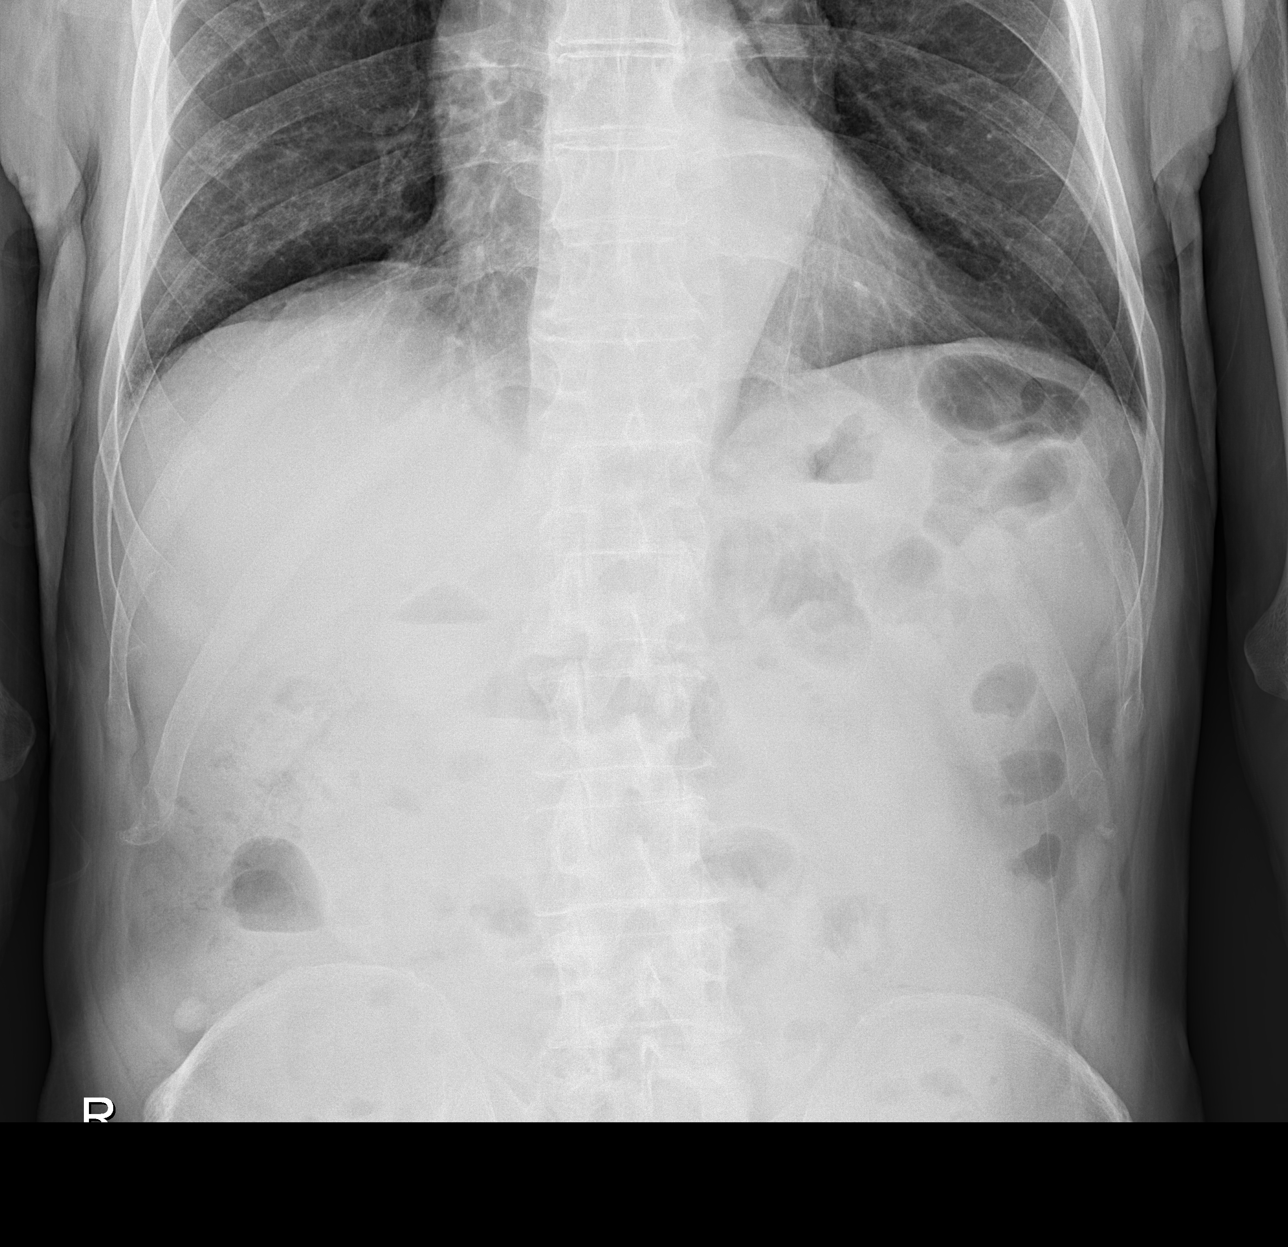

[t abdomen supine]
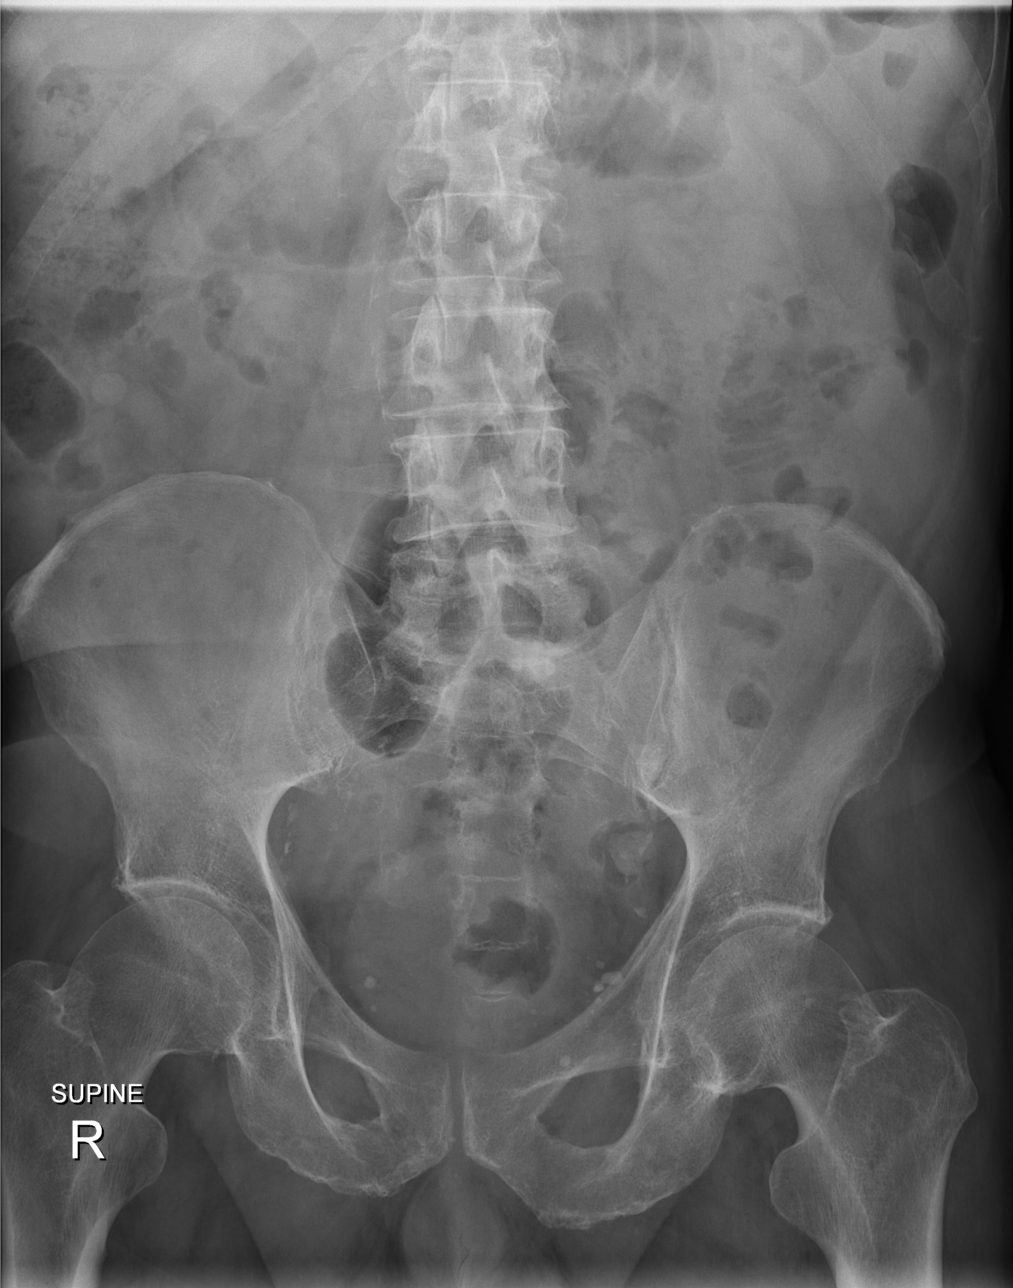

[3 of 3 positions shown; findings below may reference images not displayed]

FINDINGS: No active infiltrate or effusion is seen. Minimally prominent lung
markings most likely reflect a long time smoking history.
Mediastinal and hilar contours are unremarkable and the descending
thoracic aorta is ectatic. The heart is within normal limits in
size.

Supine and erect views of the abdomen show no bowel obstruction.
There is no radiographic evidence of constipation. No free air is a
rounded area of higher attenuation in the overlying the right lower
quadrant may be artifactual, due to a skin lesion, or possibly
represent previous contrast within a diverticulum..
IMPRESSION: 1. No active lung disease.
2. No bowel obstruction. No free air. No radiographic evidence of
osteomyelitis.
3. Can not exclude a small left lower pole renal calculus.

## 2020-06-27 IMAGING — CT CT ABD-PELV W/ CM
2 of 5 series · 15 of 46 positions shown, 17 images · IV contrast (ISOVUE)
Comparison: 03/05/2018 abdominal radiographs.

CLINICAL DATA: 77 y/o M; 3 days of abdominal pain. Last bowel
movement on [REDACTED]. History of small bowel obstruction.

EXAM:
CT ABDOMEN AND PELVIS WITH CONTRAST
TECHNIQUE: Multidetector CT imaging of the abdomen and pelvis was performed
using the standard protocol following bolus administration of
intravenous contrast.
CONTRAST:  100mL [id number] IOPAMIDOL ([id number]) INJECTION 61%

[Series 2: axial st · axial · 0.69mm/px · z∈[-479,-64]mm · 12 of 95 slices shown, 14 images]
[im 6/95  soft-tissue]
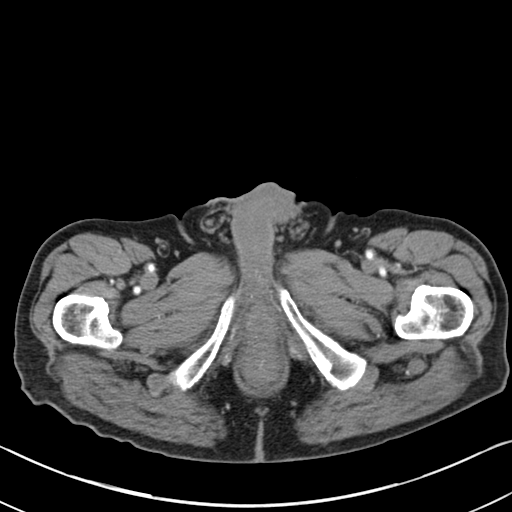
[im 6/95  bone]
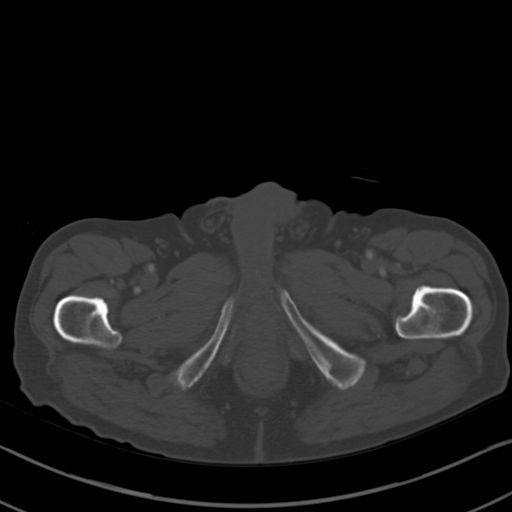
[im 17/95  soft-tissue]
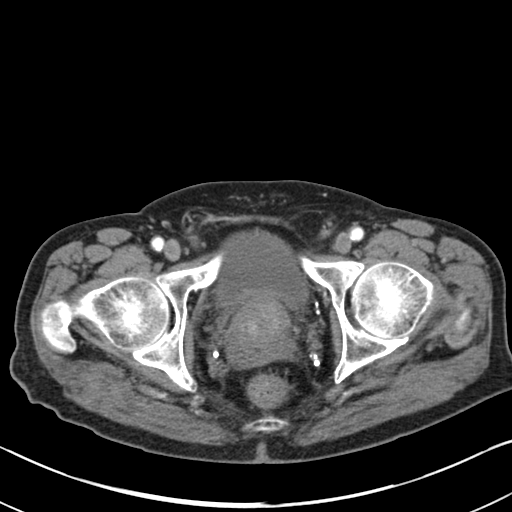
[im 23/95  soft-tissue]
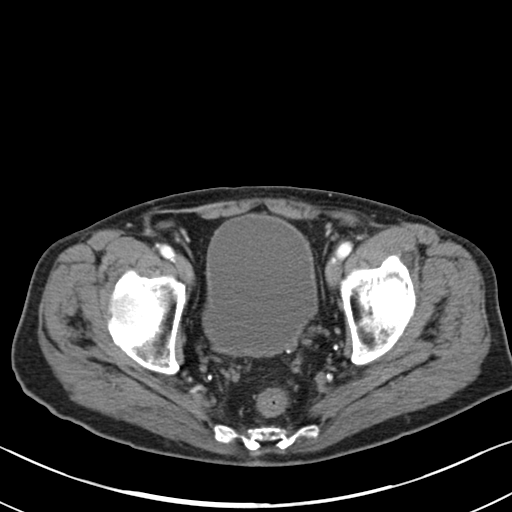
[im 28/95  soft-tissue]
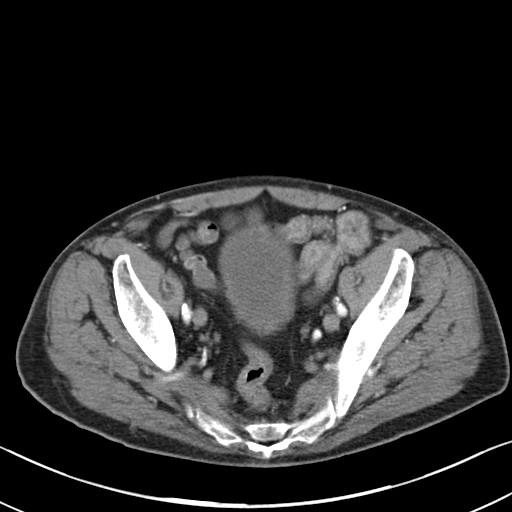
[im 39/95  soft-tissue]
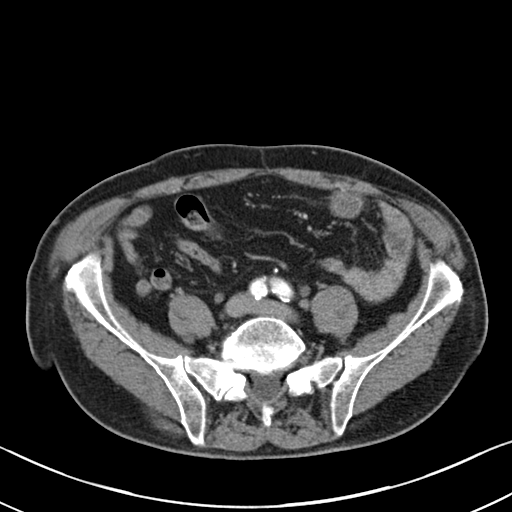
[im 45/95  soft-tissue]
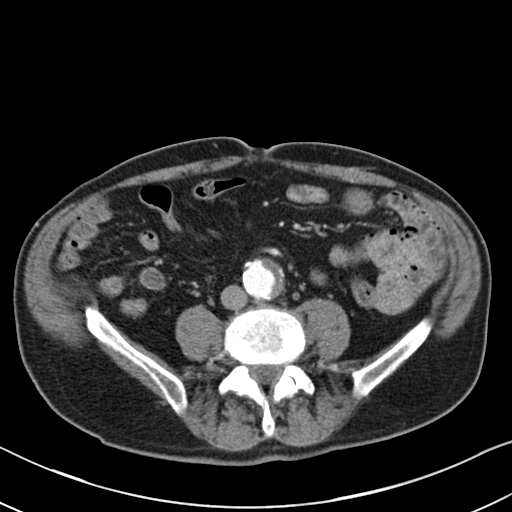
[im 50/95  soft-tissue]
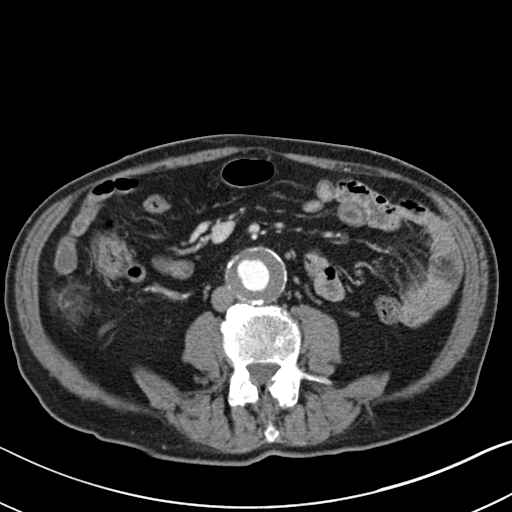
[im 61/95  soft-tissue]
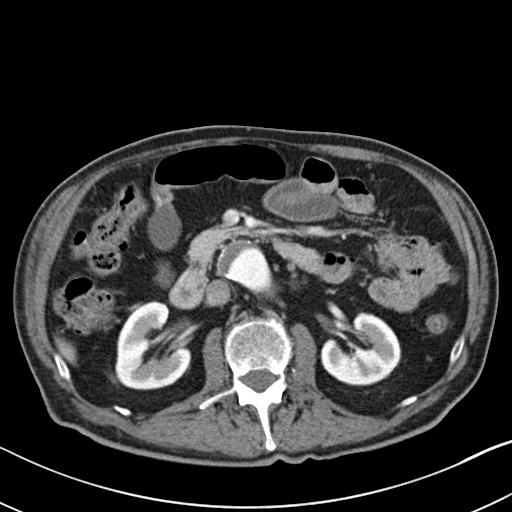
[im 67/95  soft-tissue]
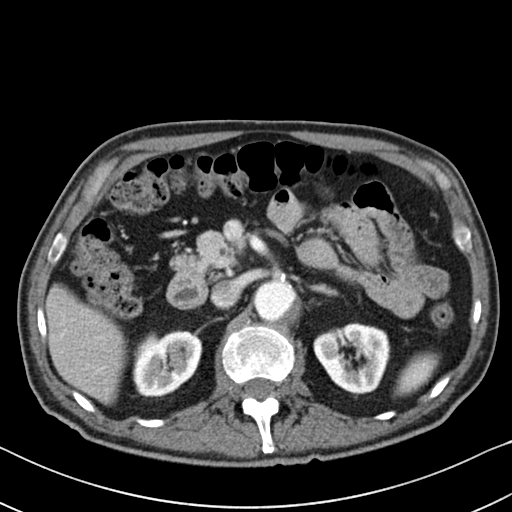
[im 67/95  bone]
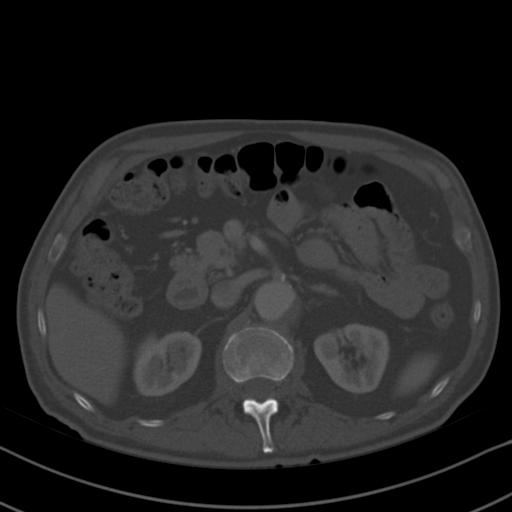
[im 72/95  soft-tissue]
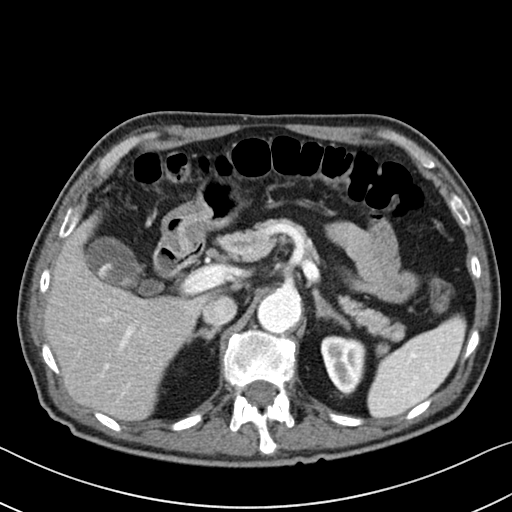
[im 83/95  soft-tissue]
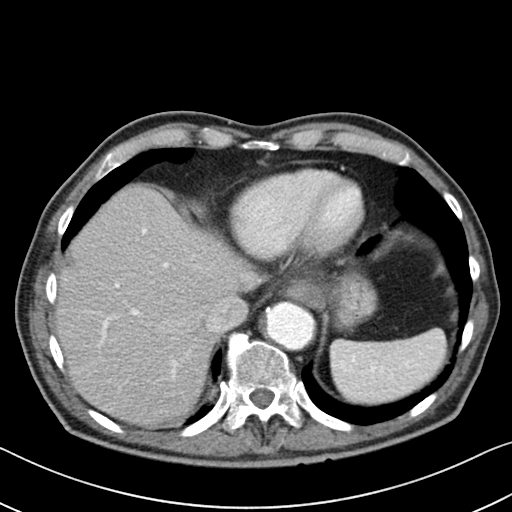
[im 89/95  soft-tissue]
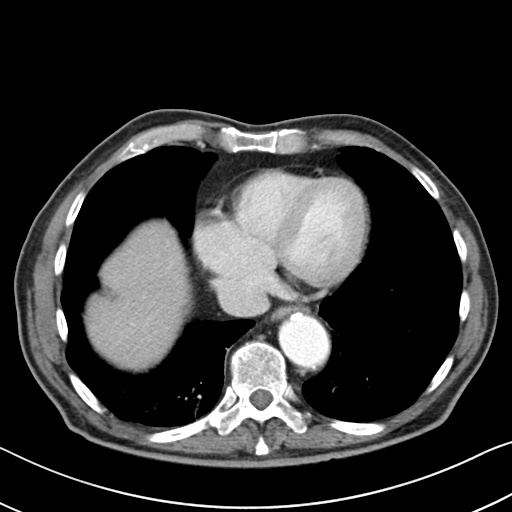

[Series 5: coronal st · coronal · 0.68mm/px · 3 of 80 slices shown]
[im 27/80  soft-tissue]
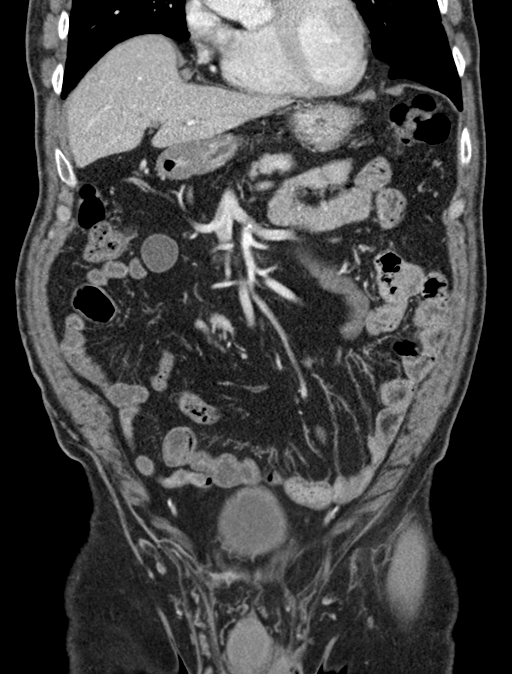
[im 36/80  soft-tissue]
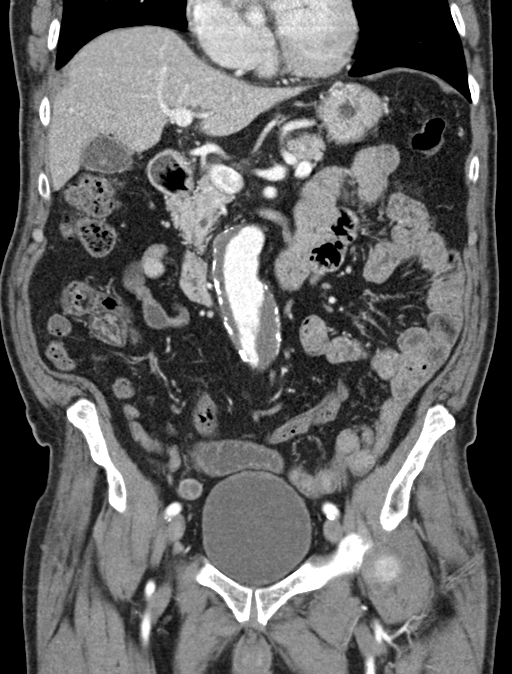
[im 44/80  soft-tissue]
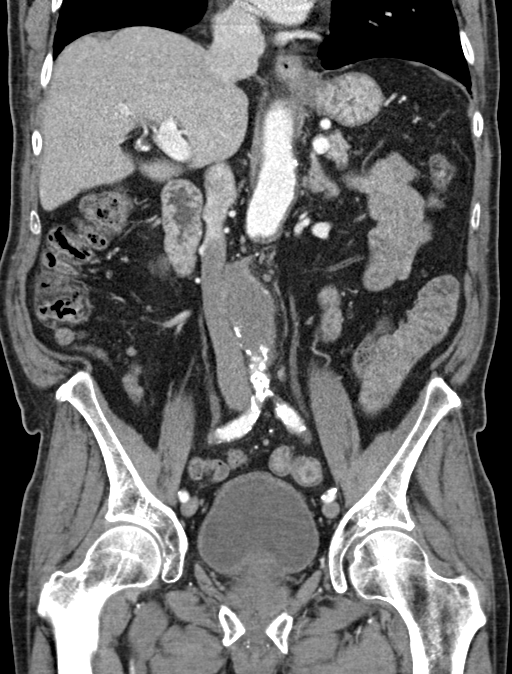

[15 of 46 positions shown; findings below may reference images not displayed]

FINDINGS: Lower chest: 3-4 mm perifissural nodules in the right middle lobe
(series 4, image 9 and 17).

Hepatobiliary: Subcentimeter hypodensity within the liver dome,
likely cyst. No additional focal liver lesion. Cholelithiasis. No
gallbladder wall thickening or pericholecystic fluid. No biliary
ductal dilatation.

Pancreas: Unremarkable. No pancreatic ductal dilatation or
surrounding inflammatory changes.

Spleen: Normal in size without focal abnormality.

Adrenals/Urinary Tract: Normal adrenal glands. 2 mm left kidney
lower pole nonobstructing stone. Tiny cortical lucency in the upper
pole of left kidney, likely cyst. Small calcifications within the
left posterior bladder likely representing bladder stones (series 2,
image 76). Punctate calcifications which appear to be within
intramural left ureterovesicular junction (series 5 image 55 and
56). No associated hydronephrosis.

Stomach/Bowel: Stomach is within normal limits. Appendix appears
normal. No evidence of bowel wall thickening, distention, or
inflammatory changes.

Vascular/Lymphatic: Aortic atherosclerosis. No enlarged abdominal or
pelvic lymph nodes. 4.4 cm infrarenal abdominal aortic aneurysm with
extensive smooth mural thrombus (series 5, image 40 and series 6,
image 56).

Reproductive: Prostate enlargement measuring 4.6 x 6.6 x 7.2 cm
(volume = 110 cm^3) extending into the floor of bladder.

Other: Small left inguinal hernia containing fat.

Musculoskeletal: No fracture is seen. Mild spondylosis of the lumbar
spine.
IMPRESSION: 1. Punctate calcifications within the left posterior bladder likely
representing bladder stones. Additional punctate calcifications,
probably within the intramural left ureterovesicular junction, no
associated hydronephrosis.
2. 2 mm left kidney lower pole nonobstructing stone.
3. Cholelithiasis.
4. 4.4 cm infrarenal abdominal aortic aneurysm. Recommend followup
by ultrasound in 1 year. This recommendation follows ACR consensus
guidelines: White Paper of the ACR Incidental Findings Committee II
on Vascular Findings. [HOSPITAL] 2232; [DATE].
5. 3-4 mm perifissural nodules in the right middle lobe. No
follow-up needed if patient is low-risk (and has no known or
suspected primary neoplasm). Non-contrast chest CT can be considered
in 12 months if patient is high-risk. This recommendation follows
the consensus statement: Guidelines for Management of Incidental
Pulmonary Nodules Detected on CT Images: From the [HOSPITAL]
6. Small left inguinal hernia containing fat.
7. Prostate enlargement, 110 cc.

## 2020-08-10 DIAGNOSIS — M79674 Pain in right toe(s): Secondary | ICD-10-CM | POA: Diagnosis not present

## 2020-08-10 DIAGNOSIS — B351 Tinea unguium: Secondary | ICD-10-CM | POA: Diagnosis not present

## 2020-08-10 DIAGNOSIS — M79675 Pain in left toe(s): Secondary | ICD-10-CM | POA: Diagnosis not present

## 2020-09-20 DIAGNOSIS — R310 Gross hematuria: Secondary | ICD-10-CM | POA: Diagnosis not present

## 2020-09-20 DIAGNOSIS — I714 Abdominal aortic aneurysm, without rupture: Secondary | ICD-10-CM | POA: Diagnosis not present

## 2020-09-29 DIAGNOSIS — N200 Calculus of kidney: Secondary | ICD-10-CM | POA: Diagnosis not present

## 2020-09-29 DIAGNOSIS — R310 Gross hematuria: Secondary | ICD-10-CM | POA: Diagnosis not present

## 2020-12-31 DIAGNOSIS — N200 Calculus of kidney: Secondary | ICD-10-CM | POA: Diagnosis not present

## 2020-12-31 DIAGNOSIS — N401 Enlarged prostate with lower urinary tract symptoms: Secondary | ICD-10-CM | POA: Diagnosis not present

## 2020-12-31 DIAGNOSIS — R319 Hematuria, unspecified: Secondary | ICD-10-CM | POA: Diagnosis not present

## 2021-01-13 DIAGNOSIS — R319 Hematuria, unspecified: Secondary | ICD-10-CM | POA: Diagnosis not present

## 2021-02-10 DIAGNOSIS — E781 Pure hyperglyceridemia: Secondary | ICD-10-CM | POA: Diagnosis not present

## 2021-02-10 DIAGNOSIS — Z0000 Encounter for general adult medical examination without abnormal findings: Secondary | ICD-10-CM | POA: Diagnosis not present

## 2021-02-10 DIAGNOSIS — N401 Enlarged prostate with lower urinary tract symptoms: Secondary | ICD-10-CM | POA: Diagnosis not present

## 2021-02-10 DIAGNOSIS — N200 Calculus of kidney: Secondary | ICD-10-CM | POA: Diagnosis not present

## 2021-02-25 DIAGNOSIS — J439 Emphysema, unspecified: Secondary | ICD-10-CM | POA: Diagnosis not present

## 2021-02-25 DIAGNOSIS — I7122 Aneurysm of the aortic arch, without rupture: Secondary | ICD-10-CM | POA: Diagnosis not present

## 2021-02-25 DIAGNOSIS — R918 Other nonspecific abnormal finding of lung field: Secondary | ICD-10-CM | POA: Diagnosis not present

## 2021-02-25 DIAGNOSIS — I7121 Aneurysm of the ascending aorta, without rupture: Secondary | ICD-10-CM | POA: Diagnosis not present

## 2021-02-25 DIAGNOSIS — Z79899 Other long term (current) drug therapy: Secondary | ICD-10-CM | POA: Diagnosis not present

## 2021-02-25 DIAGNOSIS — R911 Solitary pulmonary nodule: Secondary | ICD-10-CM | POA: Diagnosis not present

## 2022-02-01 ENCOUNTER — Telehealth: Payer: Self-pay

## 2022-02-01 NOTE — Telephone Encounter (Signed)
Transition Care Management Unsuccessful Follow-up Telephone Call  Date of discharge and from where: [facility] 01-31-22 Dx: COVID    Attempts:  1st Attempt  Reason for unsuccessful TCM follow-up call:  Left voice message   Woodfin Ganja LPN [facility] Nurse Health Advisor Direct Dial [phone]

## 2022-02-02 NOTE — Telephone Encounter (Signed)
Omar Jenkins (son) called to follow up on reason for call. Advised that Omar Jenkins was reaching out to schedule a HFU for pt. Omar Jenkins stated pt has not left the hospital and that he has moved down to [facility] and pt would be establishing care down there.

## 2022-02-02 NOTE — Telephone Encounter (Signed)
Transition Care Management Unsuccessful Follow-up Telephone Call  Date of discharge and from where:    TCM DC [facility] 01-31-22 Dx: COVID     Attempts:  2nd Attempt  Reason for unsuccessful TCM follow-up call:  Left voice message   Woodfin Ganja LPN [facility] Nurse Health Advisor Direct Dial [phone]

## 2022-02-02 NOTE — Telephone Encounter (Signed)
Transition Care Management Unsuccessful Follow-up Telephone Call  Date of discharge and from where:  TCM DC [facility] 01-31-22 Dx: COVID    Attempts:  3rd Attempt  Reason for unsuccessful TCM follow-up call:  Left voice message   Woodfin Ganja LPN [facility] Nurse Health Advisor Direct Dial [phone]

## 2022-04-06 DEATH — deceased
